# Patient Record
Sex: Male | Born: 2000 | Race: Black or African American | Hispanic: No | Marital: Single | Smoking: Never smoker
Health system: Southern US, Community
[De-identification: ages and names within clinical notes are randomized; demographics above are authoritative.]

---
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
## 2023-03-03 ENCOUNTER — Other Ambulatory Visit: Payer: Self-pay

## 2023-03-03 ENCOUNTER — Encounter (HOSPITAL_COMMUNITY): Payer: Self-pay | Admitting: *Deleted

## 2023-03-03 ENCOUNTER — Emergency Department (HOSPITAL_COMMUNITY)
Admission: EM | Admit: 2023-03-03 | Discharge: 2023-03-03 | Disposition: A | Discharge status: Home / Self Care | Payer: BC Managed Care – PPO

## 2023-03-03 DIAGNOSIS — M791 Myalgia, unspecified site: Secondary | ICD-10-CM | POA: Diagnosis not present

## 2023-03-03 DIAGNOSIS — M542 Cervicalgia: Secondary | ICD-10-CM | POA: Insufficient documentation

## 2023-03-03 DIAGNOSIS — M7918 Myalgia, other site: Secondary | ICD-10-CM

## 2023-03-03 DIAGNOSIS — Y92410 Unspecified street and highway as the place of occurrence of the external cause: Secondary | ICD-10-CM | POA: Insufficient documentation

## 2023-03-03 DIAGNOSIS — M545 Low back pain, unspecified: Secondary | ICD-10-CM | POA: Insufficient documentation

## 2023-03-03 DIAGNOSIS — M25512 Pain in left shoulder: Secondary | ICD-10-CM | POA: Insufficient documentation

## 2023-03-03 MED ORDER — CYCLOBENZAPRINE HCL 10 MG PO TABS: 10.0000 mg | ORAL_TABLET | Freq: Two times a day (BID) | ORAL | 0 refills | Status: AC | PRN

## 2023-03-03 MED ORDER — NAPROXEN 375 MG PO TABS: 375.0000 mg | ORAL_TABLET | Freq: Two times a day (BID) | ORAL | 0 refills | Status: AC

## 2023-03-03 NOTE — Discharge Instructions (Signed)
Take the medication as prescribed. If you choose to go to work, take the Flexeril only at night.   Follow up with your doctor as needed.

## 2023-03-03 NOTE — ED Notes (Signed)
Unable to discharge currently registrationin chart
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
